# Patient Record
Sex: Male | Born: 1960 | Race: White | Hispanic: No | Marital: Single | Smoking: Current every day smoker
Health system: Southern US, Community
[De-identification: ages and names within clinical notes are randomized; demographics above are authoritative.]

## PROBLEM LIST (undated history)

## (undated) DIAGNOSIS — F17200 Nicotine dependence, unspecified, uncomplicated: Secondary | ICD-10-CM

## (undated) DIAGNOSIS — J449 Chronic obstructive pulmonary disease, unspecified: Secondary | ICD-10-CM

## (undated) DIAGNOSIS — I10 Essential (primary) hypertension: Secondary | ICD-10-CM

## (undated) HISTORY — DX: Essential (primary) hypertension: I10

## (undated) HISTORY — PX: VEIN BYPASS SURGERY: SHX833

## (undated) HISTORY — DX: Chronic obstructive pulmonary disease, unspecified: J44.9

## (undated) HISTORY — DX: Nicotine dependence, unspecified, uncomplicated: F17.200

---

## 2006-07-03 DIAGNOSIS — I82409 Acute embolism and thrombosis of unspecified deep veins of unspecified lower extremity: Secondary | ICD-10-CM

## 2006-07-03 HISTORY — DX: Acute embolism and thrombosis of unspecified deep veins of unspecified lower extremity: I82.409

## 2006-10-09 ENCOUNTER — Ambulatory Visit: Payer: Self-pay | Admitting: Vascular Surgery

## 2006-10-11 ENCOUNTER — Ambulatory Visit: Payer: Self-pay | Admitting: Vascular Surgery

## 2006-10-11 ENCOUNTER — Ambulatory Visit (HOSPITAL_COMMUNITY): Admission: RE | Admit: 2006-10-11 | Discharge: 2006-10-11 | Payer: Self-pay | Admitting: Vascular Surgery

## 2006-10-19 ENCOUNTER — Inpatient Hospital Stay (HOSPITAL_COMMUNITY): Admission: RE | Admit: 2006-10-19 | Discharge: 2006-10-22 | Payer: Self-pay | Admitting: *Deleted

## 2006-11-06 ENCOUNTER — Ambulatory Visit: Payer: Self-pay | Admitting: Vascular Surgery

## 2007-05-17 ENCOUNTER — Ambulatory Visit: Payer: Self-pay | Admitting: Vascular Surgery

## 2008-01-13 ENCOUNTER — Ambulatory Visit: Payer: Self-pay | Admitting: Vascular Surgery

## 2009-01-05 ENCOUNTER — Ambulatory Visit: Payer: Self-pay | Admitting: Vascular Surgery

## 2010-11-15 NOTE — Procedures (Signed)
BYPASS GRAFT EVALUATION   INDICATION:  Follow-up evaluation of lower extremity bypass graft.   HISTORY:  Diabetes:  No.  Cardiac:  No.  Hypertension:  Yes.  Smoking:  One-and-a-half packs per day.  Previous Surgery:  Left proximal superficial femoral artery to below-  knee pop bypass graft with nonreversed translocated saphenous vein by  Dr. Hart Rochester.   SINGLE LEVEL ARTERIAL EXAM                               RIGHT              LEFT  Brachial:                    132                128  Anterior tibial:             136                138  Posterior tibial:            140                144  Peroneal:  Ankle/brachial index:        >1.0               >1.0   PREVIOUS ABI:  Date: 05/17/07  RIGHT:  >1.0  LEFT:  >1.0   LOWER EXTREMITY BYPASS GRAFT DUPLEX EXAM:   DUPLEX:  Doppler arterial waveforms are triphasic proximal to, within,  and distal to the superficial femoral to below knee pop bypass graft.   IMPRESSION:  1. Ankle brachial indices are stable from previous study bilaterally.  2. Patent left superficial femoral to below-knee popliteal bypass      graft.    ___________________________________________  Quita Skye. Hart Rochester, M.D.   MC/MEDQ  D:  01/13/2008  T:  01/13/2008  Job:  [id number]

## 2010-11-15 NOTE — Procedures (Signed)
BYPASS GRAFT EVALUATION   INDICATION:  Follow up evaluation of lower extremity bypass graft.   HISTORY:  Diabetes:  No  Cardiac:  No  Hypertension:  Yes  Smoking:  1-1/2 packs per day  Previous Surgery:  Left proximal superficial femoral artery to below  knee pop bypass graft with non-reversed translocated saphenous vein by  Dr. Hart Rochester.   SINGLE LEVEL ARTERIAL EXAM                               RIGHT              LEFT  Brachial:                    128                136  Anterior tibial:             136  Posterior tibial:            142                140  Peroneal:                                       120  Ankle/brachial index:        > 1.0              > 1.0   PREVIOUS ABI:  Date: 11/06/2006  RIGHT:  > 1.0  LEFT:  > 1.0   LOWER EXTREMITY BYPASS GRAFT DUPLEX EXAM:   DUPLEX:  Waveforms are triphasic proximal to, within, and distal to the  left superficial femoral to popliteal artery bypass graft.   IMPRESSION:  1. ABIs are stable from previous study bilaterally.  2. Patent left superficial femoral to below knee pop bypass graft.   ___________________________________________  Larina Earthly, M.D.   MC/MEDQ  D:  05/17/2007  T:  05/18/2007  Job:  [id number]

## 2010-11-15 NOTE — Procedures (Signed)
BYPASS GRAFT EVALUATION   INDICATION:  Left lower extremity bypass graft.   HISTORY:  Diabetes:  No.  Cardiac:  No.  Hypertension:  Yes.  Smoking:  Yes.  Previous Surgery:  Left proximal superficial femoral artery to popliteal  artery bypass graft on 10/19/2006.   SINGLE LEVEL ARTERIAL EXAM                               RIGHT              LEFT  Brachial:                    131                122  Anterior tibial:             119                123  Posterior tibial:            132                134  Peroneal:  Ankle/brachial index:        1.01               1.02   PREVIOUS ABI:  Date: 01/13/2008  RIGHT:  >1.0  LEFT:  >1.0   LOWER EXTREMITY BYPASS GRAFT DUPLEX EXAM:   DUPLEX:  Triphasic Doppler waveforms noted throughout the left lower  extremity bypass graft and its native vessels with no evidence of  increased velocities.   IMPRESSION:  1. Patent left superficial femoral to popliteal artery bypass graft      with no evidence of stenosis.  2. Stable bilateral ankle brachial indices.       ___________________________________________  Quita Skye Hart Rochester, M.D.   CH/MEDQ  D:  01/05/2009  T:  01/05/2009  Job:  [id number]

## 2010-11-18 NOTE — Op Note (Signed)
[name], Steve Mccoy NO.:  [id number]   MEDICAL RECORD NO.:  [id number]          PATIENT TYPE:  INP   LOCATION:  2550                         FACILITY:  [facility]   PHYSICIAN:  Quita Skye. Hart Rochester, M.D.  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   OPERATIONS:  Left proximal superficial femoral to below-knee popliteal  artery bypass using a nonreversed translocated saphenous vein graft from  the left leg with intraoperative arteriogram.   SURGEON:  Quita Skye. Hart Rochester, M.D.   FIRST ASSISTANT:  Jerold Coombe, P.A.   ANESTHESIA:  General endotracheal.   PROCEDURE:  The patient was taken to the operating room, placed in the  supine position at which time satisfactory general endotracheal  anesthesia was administered.  The left leg was prepped with Betadine  scrub solution, draped in routine sterile manner.  Saphenous vein was  then exposed through multiple incisions along the medial aspect of the  left leg beginning at the saphenofemoral junction.  Initially, the above-  knee popliteal artery was explored, thinking this may be our distal  site, but it was diffusely diseased in the above-knee position.  Therefore, the vein was removed down to the proximal third of the calf.  Popliteal artery exposed below the knee where it was a soft vessel,  although it did have some disease at the tibioperoneal trunk level.  The  superficial femoral artery was exposed at its origin where it was a  normal vessel and had an excellent pulse.  Saphenous vein was removed,  its branches dilated ligated with 4-0 silk ties.  It was gently dilated  with  heparinized saline and marked for orientation purposes.  It was an  adequate vein, being 2.5 to 3 mm in size.  The patient was then  heparinized, femoral vessels occluded proximally, and a proximal  anastomosis done in the very proximal portion of the superficial femoral  artery for length purposes.  The vein was used in a nonreversed fashion,  anastomosed end-to-side with continuous 6-0 Prolene.  Clamps were then  released.  Most of the valves were incompetent, but the remaining  competent valves were lysed with a valvulotome with good flow out of the  distal end of the vein graft.  The vein was carefully delivered through  the tunnel which was a subfascial anatomic tunnel, and it was then  anastomosed end-to-side to the below-knee popliteal artery with 6-0  Prolene.  Clamps were then released, and there was excellent pulse in  the vein  graft and excellent Doppler flow in the posterior tibial artery at the  ankle.  Protamine was given to reverse the heparin following adequate  hemostasis.  Wound was irrigated with saline, closed in layers with  Vicryl in a subcuticular fashion.  Sterile dressing applied.  The  patient was taken to the recovery in satisfactory condition.      Quita Skye Hart Rochester, M.D.  Electronically Signed     JDL/MEDQ  D:  10/19/2006  T:  10/19/2006  Job:  [id number]

## 2010-11-18 NOTE — Discharge Summary (Signed)
Steve Mccoy, MEALS NO.:  [id number]   MEDICAL RECORD NO.:  [id number]          PATIENT TYPE:  INP   LOCATION:  2030                         FACILITY:  [facility]   PHYSICIAN:  Quita Skye. Hart Rochester, M.D.  DATE OF BIRTH:  03/11/1961   DATE OF ADMISSION:  10/19/2006  DATE OF DISCHARGE:  10/21/2006                               DISCHARGE SUMMARY   DISCHARGE DIAGNOSES:  1. 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  2. Hypertension.   HISTORY OF PRESENT ILLNESS:  The patient is 50 year old male who  increased left claudication symptoms over the last 6-8 weeks where his  left calf becomes quite tight with walking less than half a foot.  He  noted some bluish discoloration of first toe about 6 days previously.  The patient is unable to work because of his left leg discomfort.  The  patient was seen and evaluated by Dr. Hart Rochester.  Dr. Hart Rochester discussed with  the patient undergoing left femoral to popliteal bypass grafting.  He  discussed risks and benefits with the patient.  The patient acknowledged  understanding and agreed to proceed.  Surgery was scheduled for October 19, 2006.  For details of the patient's past medical history and  physical exam, please see dictated H&P.   HOSPITAL COURSE:  The patient was taken to the operating room on October 19, 2006, where he underwent left proximal superficial femoral to below-  knee popliteal artery bypass using a nonreverse translocated saphenous  vein graft from left leg with intraoperative arteriogram.  The patient  tolerated this procedure well and was transferred to 2000 in stable  condition.  The patient's postoperative course was pretty much  unremarkable.  Vital signs remained stable.  He remained afebrile.  Saturations greater than 90% on room air.  The patient remained  hemodynamically stable with hemoglobin/hematocrit on postop day #1  measuring 13.2 and 38.7%.   The patient's incisions were clean, dry and  intact.  Graft remained patent.  He had 2+ palpable DP and PT pulses on  the left.  The patient was out of bed ambulating.  On postop day #1, he  continued to improve over his hospital stay.   The patient is tentatively ready for discharge home on postop day #3,  October 22, 2006.   FOLLOW UP:  A followup appointment will be arranged with Dr. Hart Rochester in 3  weeks.  Our office will contact the patient with this information.   ACTIVITY:  The patient is instructed to do no driving until released to  do so, no heavy lifting over 10 pounds.  The patient is told to ambulate  three to four times per day, progress as tolerated.  Continue his  breathing exercises.   WOUND CARE:  The patient is told to shower washing his incisions using  soap and water.  He is to contact the office if he develops any drainage  or opening from any of his incision sites.   DIET:  The patient should continue low-fat, low-salt.  DISCHARGE MEDICATIONS:  1. Lisinopril 20 mg daily.  2. Oxycodone 5 mg one to two tablets q.4-6 h. p.r.n. pain.  3. Tylenol 650 mg q.4-6 h. p.r.n. pain.      Theda Belfast, PA      Quita Skye. Hart Rochester, M.D.  Electronically Signed    KMD/MEDQ  D:  10/21/2006  T:  10/22/2006  Job:  [id number]

## 2010-11-18 NOTE — H&P (Signed)
Steve Mccoy, Steve Mccoy               ACCOUNT NO.:  [id number]   MEDICAL RECORD NO.:  [id number]          PATIENT TYPE:  AMB   LOCATION:  SDS                          FACILITY:  [facility]   PHYSICIAN:  Quita Skye. Hart Rochester, M.D.  DATE OF BIRTH:  October 01, 1960   DATE OF ADMISSION:  10/11/2006  DATE OF DISCHARGE:                              HISTORY & PHYSICAL   DATE OF ADMISSION:  October 19, 2006.   CHIEF COMPLAINT:  Severe leg discomfort on the left with ambulation  secondary to femoral-popliteal occlusive disease.   PRESENT ILLNESS:  This 50 year old male patient has been having  increasing left claudication symptoms over the last 6 to 8 weeks where  his left calf becomes quite tight with walking less than a half block.  He noticed some bluish discoloration of the first toe about 6 days  previously.  He is unable to work effectively because of this left leg  discomfort, and he must rest for 4-5 minutes before being able to return  ambulating.  He has had no symptoms in the right leg.  No history of  infection, gangrene, nonhealing ulcers or other ischemic-type problems.   PAST MEDICAL HISTORY:  1. Hypertension which is not treated medically.  2. Negative for diabetes, coronary artery disease, COPD, stroke.   FAMILY HISTORY:  Positive for diabetes in his mother, coronary artery  disease in an aunt.  Negative for stroke.   SOCIAL HISTORY:  He is married and has no children.  He works at  [employer] in [place].  Smokes one and one-half packs of  cigarettes per day for the past 30 years.  Drinks beer on a daily basis.   ALLERGIES:  NONE KNOWN.   MEDICATIONS:  Oxycodone   REVIEW OF SYSTEMS:  Denies chest pain, dyspnea on exertion, PND,  orthopnea, anorexia, weight loss, GI complaints such as hematemesis,  melena, peptic ulcer disease, gastroesophageal reflux symptoms.  Has no  urinary symptoms.   PHYSICAL EXAMINATION:  Blood pressure 150/98, heart rate 72,  respirations are 18.  GENERAL: He is a middle-aged male in no apparent distress.  He is alert  and oriented x3.  NECK:  Supple, 3+ carotid pulses palpable.  No bruits are audible.  No  palpable adenopathy in the neck.  NEUROLOGIC: Normal.  CHEST:  Clear to auscultation.  CARDIOVASCULAR EXAM:  Reveals a regular rhythm with no murmurs.  ABDOMEN:  Soft, nontender with no palpable masses.  Right leg has 3+ femoral, popliteal and dorsalis pedis pulses palpable.  Left leg has a 3+ femoral pulse with absent distal pulses.  There is no  calf tenderness.  He does have some mild bluish discoloration of the  left first toe which is nontender.   Lower extremity arterial Dopplers were performed which revealed ABIs of  0.57 on the left and 1.1 on the right.  Angiogram was performed by Dr.  Hart Rochester on April 10 which revealed a total occlusion of his left  superficial femoral artery in the mid thigh with reconstitution of above-  knee popliteal artery and three-vessel runoff, the best vessel being the  posterior tibial artery.   IMPRESSION:  1. 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  2. Hypertension.   PLAN:  Admit the patient on April 18 for elective left femoral-popliteal  bypass grafting using saphenous vein.  Risks were discussed with the  patient and he would like to proceed.      Quita Skye Hart Rochester, M.D.  Electronically Signed     JDL/MEDQ  D:  10/11/2006  T:  10/11/2006  Job:  [id number]   cc:   Kirstie Peri, MD

## 2010-11-18 NOTE — Op Note (Signed)
Steve Mccoy, Steve Mccoy               ACCOUNT NO.:  [id number]   MEDICAL RECORD NO.:  [id number]          PATIENT TYPE:  AMB   LOCATION:  SDS                          FACILITY:  [facility]   PHYSICIAN:  Quita Skye. Hart Rochester, M.D.  DATE OF BIRTH:  [birth year]   DATE OF PROCEDURE:  10/11/2006  DATE OF DISCHARGE:                               OPERATIVE REPORT   PREOPERATIVE DIAGNOSES:  Severe claudication left leg secondary to  femoral-popliteal and tibial occlusive disease.   PROCEDURE:  1. Abdominal aortogram with bilateral lower extremity runoff via right      common femoral approach.  2. Selective catheterization left common iliac artery with left leg      angiogram.  3. Selective left renal angiogram.   SURGEON:  Dr. Hart Rochester   ANESTHESIA:  Local Xylocaine and Versed 2 mg intravenously.   CONTRAST:  185 mL.   COMPLICATIONS:  None.   DESCRIPTION OF PROCEDURE:  The patient was taken to [facility]  peripheral endovascular lab, placed in supine position at which time  both groins were prepped with Betadine solution draped in routine  sterile manner.  After infiltration of 1% Xylocaine the right common  femoral artery was entered percutaneously.  Guidewire passed into the  suprarenal aorta under fluoroscopic guidance.  A 5-French sheath and  dilator were passed over the guide wire, dilator removed and standard  pigtail catheter positioned in the suprarenal aorta.  Flush abdominal  aortogram was performed injecting 20 mL of contrast at 20 mL per second.  This revealed the aorta to be widely patent with two renal arteries on  the right, the largest of which was a superior renal artery, both of  which were widely patent.  Left renal artery was widely patent  proximally but appeared to have a possible stenotic lesion at the major  branching of the primary renal artery.  Therefore, later in the  procedure a selective left renal angiogram was performed using a JR-4  catheter and hand injections  which with additional views revealed this  to be widely patent with no areas of significant stenosis in the left  renal artery.  The pigtail catheter shot of the aorta also revealed the  aorta below the renal arteries to be widely patent with patent inferior  mesenteric artery.  Both common internal and external iliac arteries  were free of stenosis.  The catheter was withdrawn in the terminal  aorta.  Bilateral lower extremity runoff performed injecting 88 mL  contrast at 8 mL per second.  On the right side the iliac system was  widely patent.  No stenosis noted.  The common, superficial and profunda  femoris arteries throughout and there was three-vessel runoff through  large tibial vessels on the right.  Left side had a widely patent iliac  system and common and profunda femoris arteries were widely patent.  Superficial femoral artery was patent and normal appearing proximally  but then developed some mild disease in the proximal to mid thigh and  then was totally occluded over a 5-6 cm segment reconstituting well  above the knee in a  normal-appearing popliteal artery.  Runoff was best  in the posterior tibial artery on the left although the peroneal and  anterior tibial arteries were patent, anterior tibial being the  smallest.  Additional views of the ankle, foot and the runoff were  obtained after selectively cannulating the left common iliac artery  using the peak hold technique.  Following this the catheter was removed  over the guide-wire, sheath removed, adequate compression applied.  No  complications ensued.   FINDINGS:  1. Widely patent aortoiliac system.  2. Two widely patent renal arteries on the right, one on the left.  3. Total occlusion left superficial femoral artery in the mid thigh      with reconstitution above the knee, three-vessel runoff with the      best vessel being the posterior tibial artery on the left.  4. Widely patent right superficial femoral popliteal  and tibial      vessels.      Quita Skye Hart Rochester, M.D.  Electronically Signed     JDL/MEDQ  D:  10/11/2006  T:  10/11/2006  Job:  [id number]

## 2012-02-01 ENCOUNTER — Encounter: Payer: Self-pay | Admitting: Vascular Surgery

## 2017-05-14 ENCOUNTER — Ambulatory Visit (INDEPENDENT_AMBULATORY_CARE_PROVIDER_SITE_OTHER): Payer: BLUE CROSS/BLUE SHIELD | Admitting: *Deleted

## 2017-05-14 DIAGNOSIS — Z23 Encounter for immunization: Secondary | ICD-10-CM

## 2020-12-22 ENCOUNTER — Emergency Department (HOSPITAL_COMMUNITY): Payer: Commercial Managed Care - PPO

## 2020-12-22 ENCOUNTER — Emergency Department (HOSPITAL_COMMUNITY)
Admission: EM | Admit: 2020-12-22 | Discharge: 2020-12-22 | Discharge status: Against Medical Advice | Payer: Commercial Managed Care - PPO | Attending: Emergency Medicine | Admitting: Emergency Medicine

## 2020-12-22 DIAGNOSIS — R0781 Pleurodynia: Secondary | ICD-10-CM | POA: Insufficient documentation

## 2020-12-22 DIAGNOSIS — R072 Precordial pain: Secondary | ICD-10-CM | POA: Diagnosis not present

## 2020-12-22 DIAGNOSIS — R079 Chest pain, unspecified: Secondary | ICD-10-CM

## 2020-12-22 LAB — CBC WITH DIFFERENTIAL/PLATELET
Abs Immature Granulocytes: 0.02 10*3/uL (ref 0.00–0.07)
Basophils Absolute: 0 10*3/uL (ref 0.0–0.1)
Basophils Relative: 1 %
Eosinophils Absolute: 0 10*3/uL (ref 0.0–0.5)
Eosinophils Relative: 0 %
HCT: 35.7 % — ABNORMAL LOW (ref 39.0–52.0)
Hemoglobin: 11.7 g/dL — ABNORMAL LOW (ref 13.0–17.0)
Immature Granulocytes: 0 %
Lymphocytes Relative: 34 %
Lymphs Abs: 1.6 10*3/uL (ref 0.7–4.0)
MCH: 31.2 pg (ref 26.0–34.0)
MCHC: 32.8 g/dL (ref 30.0–36.0)
MCV: 95.2 fL (ref 80.0–100.0)
Monocytes Absolute: 0.2 10*3/uL (ref 0.1–1.0)
Monocytes Relative: 4 %
Neutro Abs: 2.8 10*3/uL (ref 1.7–7.7)
Neutrophils Relative %: 61 %
Platelets: 222 10*3/uL (ref 150–400)
RBC: 3.75 MIL/uL — ABNORMAL LOW (ref 4.22–5.81)
RDW: 17.5 % — ABNORMAL HIGH (ref 11.5–15.5)
WBC: 4.6 10*3/uL (ref 4.0–10.5)
nRBC: 0 % (ref 0.0–0.2)

## 2020-12-22 LAB — COMPREHENSIVE METABOLIC PANEL
ALT: 9 U/L (ref 0–44)
AST: 18 U/L (ref 15–41)
Albumin: 2.6 g/dL — ABNORMAL LOW (ref 3.5–5.0)
Alkaline Phosphatase: 81 U/L (ref 38–126)
Anion gap: 10 (ref 5–15)
BUN: 7 mg/dL (ref 6–20)
CO2: 24 mmol/L (ref 22–32)
Calcium: 8.6 mg/dL — ABNORMAL LOW (ref 8.9–10.3)
Chloride: 108 mmol/L (ref 98–111)
Creatinine, Ser: 0.8 mg/dL (ref 0.61–1.24)
GFR, Estimated: >60 mL/min (ref >60)
Glucose, Bld: 75 mg/dL (ref 70–99)
Potassium: 3.2 mmol/L — ABNORMAL LOW (ref 3.5–5.1)
Sodium: 142 mmol/L (ref 135–145)
Total Bilirubin: 1.2 mg/dL (ref 0.3–1.2)
Total Protein: 7.2 g/dL (ref 6.5–8.1)

## 2020-12-22 LAB — C-REACTIVE PROTEIN: CRP: 3.5 mg/dL — ABNORMAL HIGH (ref <1.0)

## 2020-12-22 LAB — SEDIMENTATION RATE: Sed Rate: 52 mm/hr — ABNORMAL HIGH (ref 0–16)

## 2020-12-22 MED ORDER — HYDROMORPHONE HCL 1 MG/ML IJ SOLN: 1.0000 mg | INTRAMUSCULAR | Status: DC | PRN

## 2020-12-22 NOTE — ED Provider Notes (Cosign Needed)
Emergency Medicine Provider Triage Evaluation Note  Steve Mccoy , a 60 y.o. male  was evaluated in triage.  Pt complains of 2-week history of progressively worsening anterior chest and abdominal pain.  He points towards the lateral aspects of his anterior chest wall as well as discomfort over area of distal sternum/xiphoid process.  He has a cough, but attributes it to his typical smoker's cough.  He has been receiving antibiotics through PICC line.  He states that he felt feverish last night, but no other fevers.  He is having significant pain to the point that he cannot sleep.  He denies any severe back pain.  Review of Systems  Positive: Chest/abdominal pain, difficulty sleeping, fever last night. Negative: New or different cough, peripheral extremity swelling or edema, peripheral edema, N/V  Physical Exam  BP 137/84 (BP Location: Left Arm)   Pulse (!) 107   Temp 99.2 F (37.3 C) (Oral)   Resp 18   SpO2 97%  Gen:   Awake, no distress   Resp:  Normal effort. Coughing MSK:   Moves extremities without difficulty  Other:  No overlying skin changes over chest/abdominal wall  Medical Decision Making  Medically screening exam initiated at 2:02 PM.  Appropriate orders placed.  Osbaldo Mark was informed that the remainder of the evaluation will be completed by another provider, this initial triage assessment does not replace that evaluation, and the importance of remaining in the ED until their evaluation is complete.  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  He has been receiving antibiotics through PICC line.  This was at [facility].     Corena Herter, PA-C 12/22/20 1402

## 2020-12-22 NOTE — ED Triage Notes (Signed)
Pt here with with c/o cp and back pain having to sleep sitting up to sleep . Pt is smoker and on Iv antibiotic for 2 more weeks , for infection in blood stream

## 2020-12-22 NOTE — ED Notes (Signed)
PT left AMA. PA notified.

## 2020-12-22 NOTE — ED Provider Notes (Signed)
[facility] EMERGENCY DEPARTMENT Provider Note   CSN: [id number] Arrival date & time: 12/22/20  1308     History No chief complaint on file.   Steve Mccoy is a 60 y.o. male.  60 year old male presents to the emergency room with complaint of pain in his ribs (left and right axillary chest areas) and sternum x3-1/2 weeks.  Patient states that he developed pain in these areas while admitted to [facility] for complex admission.  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  Patient was discharged with PICC line and has been compliant with antibiotics.  Patient states that he continues to have pain in his ribs and sternum, feels like the symptoms are not improving with his current course of treatment and presents this emergency room for further evaluation.  He denies fevers, chills, shortness of breath.  States that he is taking a muscle relaxer which causes him to sweat otherwise does not he feel like he is having adequate pain relief with his pain medications. Review of records, patient's sister had called [facility] and [facility] and reported ongoing vomiting and diarrhea, patient states he went to his doctor on Monday and they gave him a medication for this, no longer having any vomiting or diarrhea.  Patient denies pain in his back, feels like the pain he had in his back while in the hospital has resolved. Reports ongoing smoker's cough, nothing new or different about his cough today.         Home Medications Prior to Admission medications   Not on File    Allergies    Patient has no allergy information on record.  Review of Systems   Review of Systems  Constitutional:  Positive for diaphoresis. Negative for chills and fever.  Respiratory:  Negative for shortness of breath.   Cardiovascular:  Negative for chest pain.  Gastrointestinal:  Negative for abdominal  pain, constipation, diarrhea, nausea and vomiting.  Genitourinary:  Negative for difficulty urinating.  Musculoskeletal:  Negative for arthralgias and myalgias.  Skin:  Negative for rash and wound.  Neurological:  Negative for weakness.  Hematological:  Negative for adenopathy.  Psychiatric/Behavioral:  Negative for confusion.   All other systems reviewed and are negative.  Physical Exam Updated Vital Signs BP (!) 143/95   Pulse 87   Temp 98.3 F (36.8 C) (Oral)   Resp (!) 21   SpO2 96%   Physical Exam Vitals and nursing note reviewed.  Constitutional:      General: He is not in acute distress.    Appearance: Normal appearance. He is not ill-appearing, toxic-appearing or diaphoretic.  HENT:     Head: Normocephalic and atraumatic.     Nose: Nose normal.     Mouth/Throat:     Mouth: Mucous membranes are moist.  Eyes:     Conjunctiva/sclera: Conjunctivae normal.  Cardiovascular:     Rate and Rhythm: Normal rate.     Heart sounds: Normal heart sounds.  Pulmonary:     Effort: Pulmonary effort is normal.     Comments: Course lung sounds bases  Chest:     Chest wall: Tenderness present.     Comments: Diffuse chest wall tenderness without redness, ecchymosis, crepitus  Abdominal:     Palpations: Abdomen is soft.     Tenderness: There is no abdominal tenderness.  Musculoskeletal:     Cervical back: Neck supple.  Right lower leg: No edema.     Left lower leg: No edema.  Skin:    General: Skin is warm and dry.     Findings: No erythema or rash.  Neurological:     Mental Status: He is alert and oriented to person, place, and time.  Psychiatric:        Behavior: Behavior normal.    ED Results / Procedures / Treatments   Labs (all labs ordered are listed, but only abnormal results are displayed) Labs Reviewed  CBC WITH DIFFERENTIAL/PLATELET - Abnormal; Notable for the following components:      Result Value   RBC 3.75 (*)    Hemoglobin 11.7 (*)    HCT 35.7 (*)     RDW 17.5 (*)    All other components within normal limits  COMPREHENSIVE METABOLIC PANEL - Abnormal; Notable for the following components:   Potassium 3.2 (*)    Calcium 8.6 (*)    Albumin 2.6 (*)    All other components within normal limits  SEDIMENTATION RATE - Abnormal; Notable for the following components:   Sed Rate 52 (*)    All other components within normal limits  C-REACTIVE PROTEIN - Abnormal; Notable for the following components:   CRP 3.5 (*)    All other components within normal limits  C DIFFICILE QUICK SCREEN W PCR REFLEX      EKG None  Radiology DG Chest 1 View  Result Date: 12/22/2020 CLINICAL DATA:  60 year old male with chest pain and shortness of breath. EXAM: CHEST  1 VIEW COMPARISON:  Chest radiograph dated 11/15/2020 and CT dated 11/15/2020 FINDINGS: Right-sided PICC with tip over central SVC. There is mild diffuse interstitial coarsening. No focal consolidation, pleural effusion, or pneumothorax. The cardiac silhouette is within limits. Atherosclerotic calcification of the aorta. Degenerative changes of the spine. No acute osseous pathology. IMPRESSION: No active cardiopulmonary disease. Electronically Signed   By: Anner Crete M.D.   On: 12/22/2020 14:59    Procedures Procedures   Medications Ordered in ED Medications  HYDROmorphone (DILAUDID) injection 1 mg (has no administration in time range)    ED Course  I have reviewed the triage vital signs and the nursing notes.  Pertinent labs & imaging results that were available during my care of the patient were reviewed by me and considered in my medical decision making (see chart for details).  Clinical Course as of 12/22/20 2139  Wed Dec 22, 2020  1750 Case discussed with infectious disease follow at [facility]), recommends MRI T-spine and L-spine with and without contrast to see if abscess has resolved or persists.  Recommends sed rate and CRP check as well as C. difficile.  Add on blood cultures  if sed rate or CRP markedly elevated. [LM]  2138 CRP and SED rate not markedly elevated. C diff not collected (no longer having diarrhea). CBC and CMP without significant findings. Patient eloped while awaiting MRI.  [LM]    Clinical Course User Index [LM] Roque Lias   MDM Rules/Calculators/A&P                           Final Clinical Impression(s) / ED Diagnoses Final diagnoses:  Chest pain    Rx / DC Orders ED Discharge Orders     None        Roque Lias 12/22/20 2139    Lucrezia Starch, MD 12/23/20 1659

## 2020-12-22 NOTE — ED Notes (Signed)
This RN gave pt 2 warm blankets and the visitor water

## 2020-12-31 DIAGNOSIS — A4901 Methicillin susceptible Staphylococcus aureus infection, unspecified site: Secondary | ICD-10-CM

## 2020-12-31 HISTORY — DX: Methicillin susceptible Staphylococcus aureus infection, unspecified site: A49.01

## 2021-01-31 HISTORY — PX: SPINE SURGERY: SHX786

## 2021-09-17 IMAGING — DX DG CHEST 1V
1 series · 1 of 1 positions shown · non-contrast
Comparison: Chest radiograph dated 11/15/2020 and CT dated
11/15/2020

CLINICAL DATA: 60-year-old male with chest pain and shortness of
breath.

EXAM:
CHEST  1 VIEW

[chest pa]
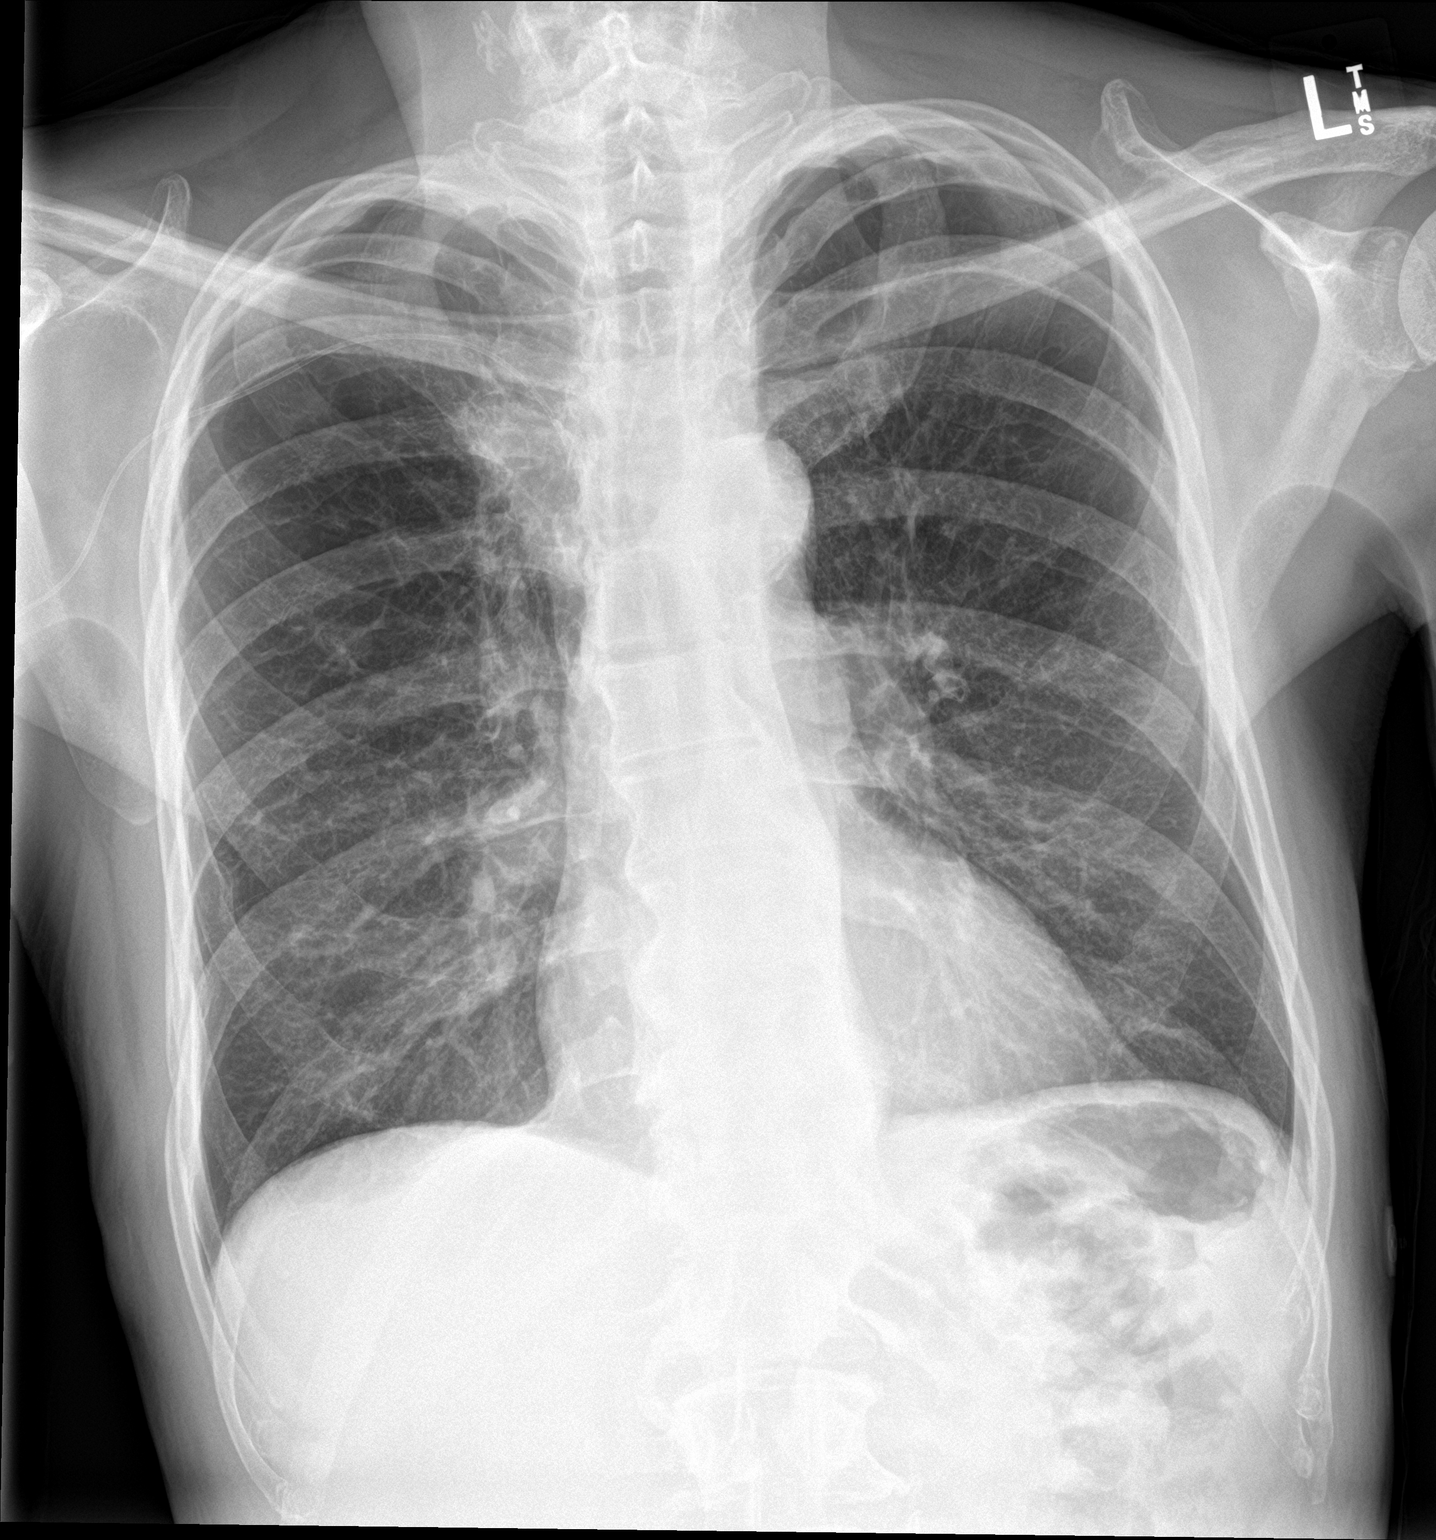

[1 of 1 positions shown; findings below may reference images not displayed]

FINDINGS: Right-sided PICC with tip over central SVC. There is mild diffuse
interstitial coarsening. No focal consolidation, pleural effusion,
or pneumothorax. The cardiac silhouette is within limits.
Atherosclerotic calcification of the aorta. Degenerative changes of
the spine. No acute osseous pathology.
IMPRESSION: No active cardiopulmonary disease.

## 2021-11-11 HISTORY — PX: COLONOSCOPY: SHX174

## 2021-11-24 ENCOUNTER — Telehealth: Payer: Self-pay | Admitting: Internal Medicine

## 2021-11-24 NOTE — Telephone Encounter (Signed)
Chart reviewed Patient had colonoscopy and polypectomy though some polyps were left on resected by general surgeon in [place], [place] She is referring for colonoscopy by gastroenterologist Polyps were mixed between adenomatous and hyperplastic  If additional polyps exist repeat colonoscopy is recommended He can come for colonoscopy for history of colonic polyps in the [facility]

## 2021-11-24 NOTE — Telephone Encounter (Signed)
Hi Dr. Hilarie Fredrickson,  (DoD 11/24/21, p.m.)  Patient's sister, Reubin Milan, called on behalf of patient.  We received a referral for him about scheduling a colonoscopy.  He just had one two weeks ago (records in [place]) at [facility].  Please advise as to how I should proceed.  Thank you.

## 2021-11-25 ENCOUNTER — Encounter: Payer: Self-pay | Admitting: Internal Medicine

## 2022-01-04 ENCOUNTER — Ambulatory Visit (AMBULATORY_SURGERY_CENTER): Payer: Self-pay | Admitting: *Deleted

## 2022-01-04 VITALS — Ht 71.0 in | Wt 145.0 lb

## 2022-01-04 DIAGNOSIS — Z860100 Personal history of colon polyps, unspecified: Secondary | ICD-10-CM

## 2022-01-04 MED ORDER — NA SULFATE-K SULFATE-MG SULF 17.5-3.13-1.6 GM/177ML PO SOLN: 1.0000 | ORAL | 0 refills | Status: DC

## 2022-01-04 MED ORDER — ONDANSETRON HCL 4 MG PO TABS: 4.0000 mg | ORAL_TABLET | ORAL | 0 refills | Status: DC

## 2022-01-04 NOTE — Progress Notes (Signed)
Patient is here in-person for PV. Patient denies any allergies to eggs or soy. Patient denies any problems with anesthesia/sedation. Patient is not on any oxygen at home. Patient is not taking any diet/weight loss medications or blood thinners. Pt denies any constipation. Went over procedure prep instructions with the patient. Patient is aware of our care-partner policy. Patient notified to call us back if suprep is not covered by insurance.

## 2022-01-24 ENCOUNTER — Ambulatory Visit (AMBULATORY_SURGERY_CENTER): Payer: Medicaid Other | Admitting: Internal Medicine

## 2022-01-24 ENCOUNTER — Encounter: Payer: Self-pay | Admitting: Internal Medicine

## 2022-01-24 VITALS — BP 131/83 | HR 72 | Temp 98.9°F | Resp 21 | Ht 71.0 in | Wt 145.0 lb

## 2022-01-24 DIAGNOSIS — Z860100 Personal history of colon polyps, unspecified: Secondary | ICD-10-CM

## 2022-01-24 DIAGNOSIS — D128 Benign neoplasm of rectum: Secondary | ICD-10-CM

## 2022-01-24 DIAGNOSIS — D127 Benign neoplasm of rectosigmoid junction: Secondary | ICD-10-CM

## 2022-01-24 DIAGNOSIS — Z09 Encounter for follow-up examination after completed treatment for conditions other than malignant neoplasm: Secondary | ICD-10-CM | POA: Diagnosis not present

## 2022-01-24 DIAGNOSIS — D120 Benign neoplasm of cecum: Secondary | ICD-10-CM | POA: Diagnosis not present

## 2022-01-24 DIAGNOSIS — D125 Benign neoplasm of sigmoid colon: Secondary | ICD-10-CM

## 2022-01-24 DIAGNOSIS — D123 Benign neoplasm of transverse colon: Secondary | ICD-10-CM

## 2022-01-24 DIAGNOSIS — D122 Benign neoplasm of ascending colon: Secondary | ICD-10-CM

## 2022-01-24 MED ORDER — SODIUM CHLORIDE 0.9 % IV SOLN: 500.0000 mL | Freq: Once | INTRAVENOUS | Status: DC

## 2022-01-24 NOTE — Patient Instructions (Addendum)
Handouts on polyps and diverticulosis given to you today  Await pathology results  NO ASPIRIN, ASPIRIN CONTAINING PRODUCTS (BC OR GOODY POWDERS) OR NSAIDS (IBUPROFEN, ADVIL, ALEVE, AND MOTRIN) FOR 2 weeks ; TYLENOL IS OK TO TAKE   YOU HAD AN ENDOSCOPIC PROCEDURE TODAY AT THE [facility]:   Refer to the procedure report that was given to you for any specific questions about what was found during the examination.  If the procedure report does not answer your questions, please call your gastroenterologist to clarify.  If you requested that your care partner not be given the details of your procedure findings, then the procedure report has been included in a sealed envelope for you to review at your convenience later.  YOU SHOULD EXPECT: Some feelings of bloating in the abdomen. Passage of more gas than usual.  Walking can help get rid of the air that was put into your GI tract during the procedure and reduce the bloating. If you had a lower endoscopy (such as a colonoscopy or flexible sigmoidoscopy) you may notice spotting of blood in your stool or on the toilet paper. If you underwent a bowel prep for your procedure, you may not have a normal bowel movement for a few days.  Please Note:  You might notice some irritation and congestion in your nose or some drainage.  This is from the oxygen used during your procedure.  There is no need for concern and it should clear up in a day or so.  SYMPTOMS TO REPORT IMMEDIATELY:  Following lower endoscopy (colonoscopy or flexible sigmoidoscopy):  Excessive amounts of blood in the stool  Significant tenderness or worsening of abdominal pains  Swelling of the abdomen that is new, acute  Fever of 100F or higher  For urgent or emergent issues, a gastroenterologist can be reached at any hour by calling [phone]. Do not use MyChart messaging for urgent concerns.    DIET:  We do recommend a small meal at first, but then you may proceed to your  regular diet.  Drink plenty of fluids but you should avoid alcoholic beverages for 24 hours.  ACTIVITY:  You should plan to take it easy for the rest of today and you should NOT DRIVE or use heavy machinery until tomorrow (because of the sedation medicines used during the test).    FOLLOW UP: Our staff will call the number listed on your records the next business day following your procedure.  We will call around 7:15- 8:00 am to check on you and address any questions or concerns that you may have regarding the information given to you following your procedure. If we do not reach you, we will leave a message.  If you develop any symptoms (ie: fever, flu-like symptoms, shortness of breath, cough etc.) before then, please call [phone].  If you test positive for Covid 19 in the 2 weeks post procedure, please call and report this information to [place].    If any biopsies were taken you will be contacted by phone or by letter within the next 1-3 weeks.  Please call us at [phone] if you have not heard about the biopsies in 3 weeks.    SIGNATURES/CONFIDENTIALITY: You and/or your care partner have signed paperwork which will be entered into your electronic medical record.  These signatures attest to the fact that that the information above on your After Visit Summary has been reviewed and is understood.  Full responsibility of the confidentiality of this discharge  information lies with you and/or your care-partner.

## 2022-01-24 NOTE — Progress Notes (Signed)
Pt non-responsive, VVS, Report to RN  °

## 2022-01-24 NOTE — Progress Notes (Signed)
GASTROENTEROLOGY PROCEDURE H&P NOTE   Primary Care Physician: Monico Blitz, MD    Reason for Procedure:  History of colonic polyps including several not removed at colonoscopy in May 2023 by Dr. Loyola Mast  Plan:    Colonoscopy with polypectomy  Patient is appropriate for endoscopic procedure(s) in the ambulatory ([place]) setting.  The nature of the procedure, as well as the risks, benefits, and alternatives were carefully and thoroughly reviewed with the patient. Ample time for discussion and questions allowed. The patient understood, was satisfied, and agreed to proceed.     HPI: Angus Amini is a 61 y.o. male who presents for colonoscopy.  Medical history as below.  Tolerated the prep.  No recent chest pain or shortness of breath.  No abdominal pain today.  Patient had colonoscopy performed in May 2023 in [place], [place].  4 polyps removed, 3 not removed due to "spasm".  He returns for additional colonoscopy and probable polypectomy today.  Past Medical History:  Diagnosis Date   COPD (chronic obstructive pulmonary disease) ([place])    DVT (deep venous thrombosis) ([place]) 2008   Hypertension    MSSA (methicillin susceptible Staphylococcus aureus) 12/2020   Smoker     Past Surgical History:  Procedure Laterality Date   COLONOSCOPY  11/11/2021   in [place], [place] polyps,removed 4. prep good.   SPINE SURGERY  01/2021   VEIN BYPASS SURGERY      Prior to Admission medications   Medication Sig Start Date End Date Taking? Authorizing Provider  amLODipine (NORVASC) 10 MG tablet Take 1 tablet by mouth daily.   Yes [provider]  doxycycline (VIBRAMYCIN) 100 MG capsule Take 100 mg by mouth 2 (two) times daily. 12/02/21  Yes [provider]  fluticasone furoate-vilanterol (BREO ELLIPTA) 100-25 MCG/ACT AEPB Inhale into the lungs. 02/20/21  Yes [provider]  methocarbamol (ROBAXIN) 500 MG tablet Take 500 mg by mouth 2 (two) times daily. 12/02/21  Yes  [provider]    Current Outpatient Medications  Medication Sig Dispense Refill   amLODipine (NORVASC) 10 MG tablet Take 1 tablet by mouth daily.     doxycycline (VIBRAMYCIN) 100 MG capsule Take 100 mg by mouth 2 (two) times daily.     fluticasone furoate-vilanterol (BREO ELLIPTA) 100-25 MCG/ACT AEPB Inhale into the lungs.     methocarbamol (ROBAXIN) 500 MG tablet Take 500 mg by mouth 2 (two) times daily.     Current Facility-Administered Medications  Medication Dose Route Frequency Provider Last Rate Last Admin   0.9 %  sodium chloride infusion  500 mL Intravenous Once Milburn Freeney, Lajuan Lines, MD        Allergies as of 01/24/2022   (No Known Allergies)    Family History  Problem Relation Age of Onset   Colon cancer Neg Hx    Esophageal cancer Neg Hx    Stomach cancer Neg Hx    Rectal cancer Neg Hx    Colon polyps Neg Hx     Social History   Socioeconomic History   Marital status: Single    Spouse name: Not on file   Number of children: Not on file   Years of education: Not on file   Highest education level: Not on file  Occupational History   Not on file  Tobacco Use   Smoking status: Every Day    Packs/day: 0.50    Types: Cigarettes   Smokeless tobacco: Never  Vaping Use   Vaping Use: Never used  Substance and  Sexual Activity   Alcohol use: Yes    Alcohol/week: 6.0 standard drinks of alcohol    Types: 6 Cans of beer per week   Drug use: Not Currently   Sexual activity: Not on file  Other Topics Concern   Not on file  Social History Narrative   Not on file   Social Determinants of Health   Financial Resource Strain: Not on file  Food Insecurity: Not on file  Transportation Needs: Not on file  Physical Activity: Not on file  Stress: Not on file  Social Connections: Not on file  Intimate Partner Violence: Not on file    Physical Exam: Vital signs in last 24 hours: '@BP'$  110/60   Pulse 99   Temp 98.9 F (37.2 C)   Ht '5\' 11"'$  (1.803 m)   Wt 145 lb  (65.8 kg)   SpO2 97%   BMI 20.22 kg/m  GEN: NAD EYE: Sclerae anicteric ENT: MMM CV: Non-tachycardic Pulm: CTA b/l GI: Soft, NT/ND NEURO:  Alert & Oriented x 3   Zenovia Jarred, MD [facility]  01/24/2022 9:00 AM

## 2022-01-24 NOTE — Progress Notes (Signed)
Pt's states no medical or surgical changes since previsit or office visit. 

## 2022-01-24 NOTE — Progress Notes (Signed)
Called to room to assist during endoscopic procedure.  Patient ID and intended procedure confirmed with present staff. Received instructions for my participation in the procedure from the performing physician.  

## 2022-01-24 NOTE — Op Note (Signed)
[facility] Patient Name: Steve Mccoy Procedure Date: 01/24/2022 8:46 AM MRN: [id number] Endoscopist: Jerene Bears , MD Age: 61 Referring MD:  Date of Birth: 1961/03/10 Gender: Male Account #: [id number] Procedure:                Colonoscopy Indications:              This patient was referred for a therapeutic                            procedure for colon polyps after colonoscopy in May                            2023 with Dr. Loyola Mast (mixed adenomas and                            hyperplastic polyps) Medicines:                Monitored Anesthesia Care Procedure:                Pre-Anesthesia Assessment:                           - Prior to the procedure, a History and Physical                            was performed, and patient medications and                            allergies were reviewed. The patient's tolerance of                            previous anesthesia was also reviewed. The risks                            and benefits of the procedure and the sedation                            options and risks were discussed with the patient.                            All questions were answered, and informed consent                            was obtained. Prior Anticoagulants: The patient has                            taken no previous anticoagulant or antiplatelet                            agents. ASA Grade Assessment: III - A patient with                            severe systemic disease. After reviewing the risks  and benefits, the patient was deemed in                            satisfactory condition to undergo the procedure.                           After obtaining informed consent, the colonoscope                            was passed under direct vision. Throughout the                            procedure, the patient's blood pressure, pulse, and                            oxygen saturations were monitored continuously. The                             PCF-HQ190L Colonoscope was introduced through the                            anus and advanced to the cecum, identified by                            appendiceal orifice and ileocecal valve. The                            colonoscopy was performed without difficulty. The                            patient tolerated the procedure well. The quality                            of the bowel preparation was good. The ileocecal                            valve, appendiceal orifice, and rectum were                            photographed. Findings:                 The digital rectal exam was normal.                           A 3 mm polyp was found in the cecum. The polyp was                            sessile. The polyp was removed with a cold snare.                            Resection and retrieval were complete.                           Two sessile polyps were found in the  ascending                            colon. The polyps were 4 to 6 mm in size. These                            polyps were removed with a cold snare. Resection                            and retrieval were complete.                           A 20 mm polyp was found in the ascending colon. The                            polyp was semi-pedunculated with an umbilicated                            center. The polyp was actively extruding mucus with                            lavage. The polyp was removed with a hot snare.                            Resection and retrieval were complete. To prevent                            bleeding after the polypectomy, one hemostatic clip                            was successfully placed (MR conditional). There was                            no bleeding during, or at the end, of the procedure.                           A 6 mm polyp was found in the transverse colon. The                            polyp was sessile. The polyp was removed with a                             cold snare. Resection and retrieval were complete.                           Three sessile polyps were found in the sigmoid                            colon. The polyps were 8 to 12 mm in size. These                            appeared most consistent with mucosal prolapse  polyp. These polyps were removed with a cold snare.                            Resection and retrieval were complete.                           Two sessile polyps were found in the rectum and                            distal sigmoid colon. The polyps were 4 to 6 mm in                            size. These polyps were removed with a cold snare.                            Resection and retrieval were complete.                           Multiple small and large-mouthed diverticula were                            found from ascending colon to sigmoid colon.                           The retroflexed view of the distal rectum and anal                            verge was normal and showed no anal or rectal                            abnormalities. Complications:            No immediate complications. Impression:               - One 3 mm polyp in the cecum, removed with a cold                            snare. Resected and retrieved.                           - Two 4 to 6 mm polyps in the ascending colon,                            removed with a cold snare. Resected and retrieved.                           - One 20 mm polyp in the ascending colon, removed                            with a hot snare. Resected and retrieved. Clip (MR                            conditional) was placed.                           -  One 6 mm polyp in the transverse colon, removed                            with a cold snare. Resected and retrieved.                           - Three 8 to 12 mm polyps in the sigmoid colon,                            removed with a cold snare. Resected and retrieved.                            - Two 4 to 6 mm polyps in the rectum and in the                            distal sigmoid colon, removed with a cold snare.                            Resected and retrieved.                           - Moderate diverticulosis from ascending colon to                            sigmoid colon.                           - The distal rectum and anal verge are normal on                            retroflexion view. Recommendation:           - Patient has a contact number available for                            emergencies. The signs and symptoms of potential                            delayed complications were discussed with the                            patient. Return to normal activities tomorrow.                            Written discharge instructions were provided to the                            patient.                           - Resume previous diet.                           - Continue present medications.                           -  No aspirin, ibuprofen, naproxen, or other                            non-steroidal anti-inflammatory drugs for 2 weeks                            after polyp removal.                           - Await pathology results.                           - Repeat colonoscopy is recommended for                            surveillance. The colonoscopy date will be                            determined after pathology results from today's                            exam become available for review. Jerene Bears, MD 01/24/2022 10:09:26 AM This report has been signed electronically.

## 2022-01-25 ENCOUNTER — Telehealth: Payer: Self-pay

## 2022-01-25 NOTE — Telephone Encounter (Signed)
  Follow up Call-     01/24/2022    7:56 AM  Call back number  Post procedure Call Back phone  # [phone]  Permission to leave phone message Yes     Patient questions:  Do you have a fever, pain , or abdominal swelling? No. Pain Score  0 *  Have you tolerated food without any problems? Yes.    Have you been able to return to your normal activities? Yes.    Do you have any questions about your discharge instructions: Diet   No. Medications  No. Follow up visit  No.  Do you have questions or concerns about your Care? No.  Actions: * If pain score is 4 or above: No action needed, pain <4.

## 2022-01-27 ENCOUNTER — Encounter: Payer: Self-pay | Admitting: Internal Medicine

## 2023-05-17 ENCOUNTER — Encounter: Payer: Self-pay | Admitting: Internal Medicine
